# Patient Record
Sex: Male | Born: 1970 | Race: White | Hispanic: No | Marital: Single | Smoking: Never smoker
Health system: Southern US, Community
[De-identification: ages and names within clinical notes are randomized; demographics above are authoritative.]

## PROBLEM LIST (undated history)

## (undated) DIAGNOSIS — E079 Disorder of thyroid, unspecified: Secondary | ICD-10-CM

## (undated) HISTORY — PX: NASAL SEPTUM SURGERY: SHX37

## (undated) HISTORY — PX: HEEL SPUR SURGERY: SHX665

---

## 2018-05-20 ENCOUNTER — Other Ambulatory Visit: Payer: Self-pay

## 2018-05-20 ENCOUNTER — Ambulatory Visit
Admission: EM | Admit: 2018-05-20 | Discharge: 2018-05-20 | Disposition: A | Discharge status: Home / Self Care | Payer: 59 | Attending: Emergency Medicine | Admitting: Emergency Medicine

## 2018-05-20 ENCOUNTER — Ambulatory Visit (INDEPENDENT_AMBULATORY_CARE_PROVIDER_SITE_OTHER): Payer: 59

## 2018-05-20 DIAGNOSIS — R079 Chest pain, unspecified: Secondary | ICD-10-CM

## 2018-05-20 DIAGNOSIS — M940 Chondrocostal junction syndrome [Tietze]: Secondary | ICD-10-CM

## 2018-05-20 HISTORY — DX: Disorder of thyroid, unspecified: E07.9

## 2018-05-20 MED ORDER — NAPROXEN 500 MG PO TABS: 500.0000 mg | ORAL_TABLET | Freq: Two times a day (BID) | ORAL | 0 refills | Status: DC

## 2018-05-20 NOTE — Discharge Instructions (Addendum)
Your EKG and chest x-ray were both normal.  Suspect that this is musculoskeletal.  Try Naprosyn 500 mg twice a day for the next 5 to 7 days.  You may take 1 g of Tylenol with this.  Follow-up with your doctor if you are not getting better in a week, and go immediately to the ER for chest pressure, chest heaviness, if the pain goes up your neck, down your arm, through to your back, if he gets worse with walking around, if you pass out, if you have trouble breathing, or for any other concerns.

## 2018-05-20 NOTE — ED Triage Notes (Signed)
Pt with tenderness to right chest. Radiates with movement. Pain 3/10. Sx x past 3-4 days. Tender to touch

## 2018-05-20 NOTE — ED Provider Notes (Signed)
HPI  SUBJECTIVE:  Anthony Duncan is a 48 y.o. male who presents with 3 days of right-sided chest pain at the sternal/costochondral junction.  He describes the pain as being sore, like a bruise, and radiates to both sides of his chest. It Is intermittent, depends on movement.  He points to his right sternum/costochondral junction with 2 fingers as the area of pain and states that this area is tender.  He is able to exercise without any problem.  He denies nausea, vomiting, diaphoresis, coughing, wheezing, shortness of breath, dyspnea on exertion, hemoptysis, calf pain, swelling, surgery in the past 4 weeks, prolonged immobilization, exogenous estrogen.  No change in his physical activity, trauma to his chest.  No fall.  No rash, erythema, bruising.  He had symptoms like this 3 years ago had a normal EKG and stress test, no cause found.  He tried ibuprofen 600 mg without improvement of symptoms.  Symptoms are better with sitting up straight and holding his shoulders back, worse with palpation, having his arms extended forward, torso rotation and moving his right arm in a rowing motion.  Past medical history negative for DVT, PE, cancer, diabetes, hypertension, coronary disease, hypercholesterolemia, smoking, pneumothorax, renal insufficiency, GI bleed, gastric ulcers.  Family history significant for maternal grandfather with MI maybe in his 39s, patient is not entirely sure.  PMD: [facility].    Past Medical History:  Diagnosis Date  . Thyroid disease     Past Surgical History:  Procedure Laterality Date  . NASAL SEPTUM SURGERY      History reviewed. No pertinent family history.  Social History   Tobacco Use  . Smoking status: Never Smoker  . Smokeless tobacco: Never Used  Substance Use Topics  . Alcohol use: Never    Frequency: Never  . Drug use: Never    No current facility-administered medications for this encounter.   Current Outpatient Medications:  .  levothyroxine  (SYNTHROID, LEVOTHROID) 88 MCG tablet, Take by mouth., Disp: , Rfl:  .  Multiple Vitamin (MULTIVITAMIN) tablet, Take 1 tablet by mouth daily., Disp: , Rfl:  .  loratadine (CLARITIN) 10 MG tablet, Take by mouth., Disp: , Rfl:  .  naproxen (NAPROSYN) 500 MG tablet, Take 1 tablet (500 mg total) by mouth 2 (two) times daily. X 7 days, Disp: 14 tablet, Rfl: 0  Allergies  Allergen Reactions  . Sumatriptan Succinate Other (See Comments)    Headache     ROS  As noted in HPI.   Physical Exam  BP 131/71 (BP Location: Right Arm)   Pulse 70   Temp 97.7 F (36.5 C) (Oral)   Resp 16   Ht 6' (1.829 m)   Wt 117.9 kg   SpO2 100%   BMI 35.26 kg/m   Constitutional: Well developed, well nourished, no acute distress Eyes:  EOMI, conjunctiva normal bilaterally HENT: Normocephalic, atraumatic,mucus membranes moist Respiratory: Normal inspiratory effort lungs clear bilaterally, good air movement. Cardiovascular: Normal rate regular rhythm, no murmurs, rubs, gallops.  Positive tenderness at the costochondral junctions at the sternum bilaterally.  Pain aggravated with torso rotation, movement.  RP 2+ and equal bilaterally. GI: nondistended skin: No rash, bruising over the chest, skin intact Musculoskeletal: Calves symmetric, nontender, no edema.  No palpable cord. Neurologic: Alert & oriented x 3, no focal neuro deficits Psychiatric: Speech and behavior appropriate  ED Course   Medications - No data to display  Orders Placed This Encounter  Procedures  . DG Chest 2 View  Standing Status:   Standing    Number of Occurrences:   1    Order Specific Question:   Reason for Exam (SYMPTOM  OR DIAGNOSIS REQUIRED)    Answer:   sternal CP  . ED EKG    Standing Status:   Standing    Number of Occurrences:   1    Order Specific Question:   Reason for Exam    Answer:   Chest Pain    No results found for this or any previous visit (from the past 24 hour(s)). Dg Chest 2 View  Result Date:  05/20/2018 CLINICAL DATA:  Substernal chest pain x 3 days with no breathing difficulties. EXAM: CHEST - 2 VIEW COMPARISON:  None. FINDINGS: Midline trachea. Normal heart size and mediastinal contours. No pleural effusion or pneumothorax. Clear lungs. IMPRESSION: Normal chest. Electronically Signed   By: Jeronimo Greaves M.D.   On: 05/20/2018 11:57    ED Clinical Impression  Costochondritis   ED Assessment/Plan  Patient PERC negative.  Appears to be very musculoskeletal as he does have reproducible chest wall tenderness in the left and right.  However will check an EKG and a chest x-ray  EKG: Normal sinus rhythm, rate 65.  Normal axis, normal intervals.  No hypertrophy.  No previous EKG for comparison.  Reviewed imaging independently.  Normal chest x-ray see radiology report for full details.  EKG reassuring, chest x-ray normal.  Will treat as costochondritis with 500 mg of Naprosyn twice daily for the next 5 to 7 days.  Follow-up with PMD as needed, to the ER if he gets worse.  Discussed imaging, MDM, treatment plan, and plan for follow-up with patient. Discussed sn/sx that should prompt return to the ED. patient agrees with plan.   Meds ordered this encounter  Medications  . naproxen (NAPROSYN) 500 MG tablet    Sig: Take 1 tablet (500 mg total) by mouth 2 (two) times daily. X 7 days    Dispense:  14 tablet    Refill:  0    *This clinic note was created using Scientist, clinical (histocompatibility and immunogenetics). Therefore, there may be occasional mistakes despite careful proofreading.   ?   Domenick Gong, MD 05/20/18 1810

## 2019-11-27 ENCOUNTER — Encounter: Payer: Self-pay | Admitting: Emergency Medicine

## 2019-11-27 ENCOUNTER — Ambulatory Visit
Admission: EM | Admit: 2019-11-27 | Discharge: 2019-11-27 | Disposition: A | Discharge status: Home / Self Care | Payer: BC Managed Care – PPO | Attending: Emergency Medicine | Admitting: Emergency Medicine

## 2019-11-27 ENCOUNTER — Other Ambulatory Visit: Payer: Self-pay

## 2019-11-27 ENCOUNTER — Ambulatory Visit (INDEPENDENT_AMBULATORY_CARE_PROVIDER_SITE_OTHER): Payer: BC Managed Care – PPO

## 2019-11-27 DIAGNOSIS — R0602 Shortness of breath: Secondary | ICD-10-CM | POA: Diagnosis present

## 2019-11-27 DIAGNOSIS — Z79899 Other long term (current) drug therapy: Secondary | ICD-10-CM | POA: Insufficient documentation

## 2019-11-27 DIAGNOSIS — Z8349 Family history of other endocrine, nutritional and metabolic diseases: Secondary | ICD-10-CM | POA: Diagnosis not present

## 2019-11-27 DIAGNOSIS — Z791 Long term (current) use of non-steroidal anti-inflammatories (NSAID): Secondary | ICD-10-CM | POA: Diagnosis not present

## 2019-11-27 DIAGNOSIS — R05 Cough: Secondary | ICD-10-CM | POA: Diagnosis not present

## 2019-11-27 DIAGNOSIS — Z79890 Hormone replacement therapy (postmenopausal): Secondary | ICD-10-CM | POA: Diagnosis not present

## 2019-11-27 DIAGNOSIS — R059 Cough, unspecified: Secondary | ICD-10-CM

## 2019-11-27 DIAGNOSIS — E079 Disorder of thyroid, unspecified: Secondary | ICD-10-CM | POA: Diagnosis not present

## 2019-11-27 DIAGNOSIS — Z888 Allergy status to other drugs, medicaments and biological substances status: Secondary | ICD-10-CM | POA: Diagnosis not present

## 2019-11-27 DIAGNOSIS — R0789 Other chest pain: Secondary | ICD-10-CM | POA: Diagnosis not present

## 2019-11-27 DIAGNOSIS — Z20822 Contact with and (suspected) exposure to covid-19: Secondary | ICD-10-CM | POA: Diagnosis not present

## 2019-11-27 DIAGNOSIS — T7840XA Allergy, unspecified, initial encounter: Secondary | ICD-10-CM

## 2019-11-27 LAB — SARS CORONAVIRUS 2 (TAT 6-24 HRS): SARS Coronavirus 2: NEGATIVE

## 2019-11-27 MED ORDER — DICLOFENAC SODIUM 75 MG PO TBEC: 75.0000 mg | DELAYED_RELEASE_TABLET | Freq: Two times a day (BID) | ORAL | 0 refills | Status: AC | PRN

## 2019-11-27 MED ORDER — ALBUTEROL SULFATE HFA 108 (90 BASE) MCG/ACT IN AERS: 2.0000 | INHALATION_SPRAY | Freq: Four times a day (QID) | RESPIRATORY_TRACT | 0 refills | Status: AC | PRN

## 2019-11-27 NOTE — ED Triage Notes (Signed)
Patient in today c/o sob this morning ~1am. Patient did call EMS and his bp was elevated, but O2Sat was 100%. Patient states his PCP started him on Amitriptyline on 11/16/19. Patient states on 11/23/19 he started with itching, rash, swelling in hands and stiffness in fingers. Patient states his fingers were red. Patient's last dose of on 11/22/19.

## 2019-11-27 NOTE — Discharge Instructions (Addendum)
Recommend continue Prednisone as prescribed. May continue Benadryl every 6 hours as needed for itching. Recommend use Albuterol inhaler 2 puffs every 6 hours as needed for shortness of breath or cough. May start Voltaren 75mg  twice a day as needed for pain- take with food. Continue to monitor symptoms. Follow-up pending COVID 19 test results and in 3 to 4 days with your PCP if symptoms are not improving. If any increase in difficulty breathing, chest pain, blurred vision or nausea occurs, go to the ER ASAP. Otherwise, follow-up with your PCP as planned.

## 2019-11-27 NOTE — ED Provider Notes (Signed)
[facility]    CSN: [id number] Arrival date & time: 11/27/19  [id number]      History   Chief Complaint Chief Complaint  Patient presents with  . Shortness of Breath    HPI Anthony Duncan is a 49 y.o. male.   49 year old male presents with shortness of breath that has occurred intermittently over the past 4 days. He was first started on a new medication, Amitriptyline, to try to help with sleep, on 11/16/19. About 1 week after taking the medication (11/23/19), he started itching and breaking out in hives. He also noticed that his fingers were swollen and red. He stopped the medication and called [facility] on 8/28 in which he was prescribed a 6 day steroid pack and started taking Benadryl every 6 hours. The hives and itching have improved but have not completely resolved and 3 days ago he started having some shortness of breath, slight cough and central back pain- especially when breathing. He also has had some nausea but no vomiting or fever. He has noticed more stomach upset after starting Prednisone. He denies any vision changes, anterior chest pain, or numbness. This morning at 1am he was struggling to breath and called EMS. They came and evaluated him- his O2Sat was 100% but he indicated his blood pressure was elevated. No other abnormalities detected. They offered to take him to the ER but warned him that in the current situation of COVID and ER capacity, that he would probably wait for over 20 hours before being seen. He called his PCP around 8am and they told him to be evaluated here. He has been vaccinated against COVID 19 and no known exposure. He does not smoke. No history of asthma or lung disease. No history of HTN or Cardiac disease. Other chronic health issues include thyroid disease and environmental allergies/drug allergies and currently on Synthroid and Claritin daily.   The history is provided by the patient.    Past Medical History:  Diagnosis Date  . Thyroid disease       There are no problems to display for this patient.   Past Surgical History:  Procedure Laterality Date  . HEEL SPUR SURGERY    . NASAL SEPTUM SURGERY         Home Medications    Prior to Admission medications   Medication Sig Start Date End Date Taking? Authorizing Provider  levothyroxine (SYNTHROID, LEVOTHROID) 88 MCG tablet Take by mouth. 01/24/17  Yes [provider]  loratadine (CLARITIN) 10 MG tablet Take by mouth.   Yes [provider]  Multiple Vitamin (MULTIVITAMIN) tablet Take 1 tablet by mouth daily.   Yes [provider]  albuterol (VENTOLIN HFA) 108 (90 Base) MCG/ACT inhaler Inhale 2 puffs into the lungs every 6 (six) hours as needed for shortness of breath (or cough). 11/27/19   Sudie GrumblingAmyot, Wilfredo Canterbury Berry, NP  diclofenac (VOLTAREN) 75 MG EC tablet Take 1 tablet (75 mg total) by mouth 2 (two) times daily as needed for moderate pain. 11/27/19   Sudie GrumblingAmyot, Khayri Kargbo Berry, NP    Family History Family History  Problem Relation Age of Onset  . Thyroid disease Mother   . Kidney disease Mother   . Healthy Father     Social History Social History   Tobacco Use  . Smoking status: Never Smoker  . Smokeless tobacco: Never Used  Vaping Use  . Vaping Use: Never used  Substance Use Topics  . Alcohol use: Never  . Drug use: Never  Allergies   Amitriptyline, Other, and Sumatriptan succinate   Review of Systems Review of Systems  Constitutional: Positive for fatigue. Negative for activity change, appetite change, chills, diaphoresis and fever.  HENT: Negative for congestion, facial swelling, sore throat and trouble swallowing.   Eyes: Negative for photophobia and visual disturbance.  Respiratory: Positive for cough and shortness of breath. Negative for chest tightness and wheezing.   Cardiovascular: Positive for chest pain (posterior). Negative for palpitations.  Gastrointestinal: Positive for abdominal pain (mainly after taking steroids) and nausea.  Negative for vomiting.  Genitourinary: Negative for decreased urine volume, difficulty urinating, flank pain and hematuria.  Musculoskeletal: Positive for back pain. Negative for neck pain and neck stiffness.  Skin: Positive for color change and rash. Negative for wound.  Allergic/Immunologic: Positive for environmental allergies. Negative for immunocompromised state.  Neurological: Negative for dizziness, tremors, seizures, syncope, facial asymmetry, speech difficulty, weakness, light-headedness, numbness and headaches.  Hematological: Negative for adenopathy. Does not bruise/bleed easily.  Psychiatric/Behavioral: Positive for sleep disturbance.     Physical Exam Triage Vital Signs ED Triage Vitals  Enc Vitals Group     BP 11/27/19 0915 129/81     Pulse Rate 11/27/19 0915 77     Resp 11/27/19 0915 18     Temp 11/27/19 0915 98.3 F (36.8 C)     Temp Source 11/27/19 0915 Oral     SpO2 11/27/19 0915 98 %     Weight 11/27/19 0914 289 lb (131.1 kg)     Height 11/27/19 0914 6' (1.829 m)     Head Circumference --      Peak Flow --      Pain Score 11/27/19 0914 0     Pain Loc --      Pain Edu? --      Excl. in GC? --    No data found.  Updated Vital Signs BP 129/81 (BP Location: Left Arm)   Pulse 77   Temp 98.3 F (36.8 C) (Oral)   Resp 18   Ht 6' (1.829 m)   Wt 289 lb (131.1 kg)   SpO2 98%   BMI 39.20 kg/m   Visual Acuity Right Eye Distance:   Left Eye Distance:   Bilateral Distance:    Right Eye Near:   Left Eye Near:    Bilateral Near:     Physical Exam Vitals and nursing note reviewed.  Constitutional:      General: He is awake. He is not in acute distress.    Appearance: He is well-developed, well-groomed and overweight. He is not ill-appearing.     Comments: He is sitting comfortably in the exam chair in no acute distress.   HENT:     Head: Normocephalic and atraumatic.     Right Ear: Hearing, tympanic membrane, ear canal and external ear normal.     Left  Ear: Hearing, tympanic membrane, ear canal and external ear normal.     Nose: Nose normal.     Mouth/Throat:     Lips: Pink.     Mouth: Mucous membranes are moist.     Pharynx: Oropharynx is clear. Uvula midline. No pharyngeal swelling, oropharyngeal exudate, posterior oropharyngeal erythema or uvula swelling.  Eyes:     Extraocular Movements: Extraocular movements intact.     Conjunctiva/sclera: Conjunctivae normal.     Pupils: Pupils are equal, round, and reactive to light.  Neck:     Vascular: Normal carotid pulses. No carotid bruit or JVD.     Trachea: Trachea  normal.  Cardiovascular:     Rate and Rhythm: Normal rate and regular rhythm.     Pulses: Normal pulses.     Heart sounds: Normal heart sounds. No murmur heard.   Pulmonary:     Effort: Pulmonary effort is normal. No respiratory distress.     Breath sounds: Normal breath sounds and air entry. No decreased air movement. No decreased breath sounds, wheezing, rhonchi or rales.  Musculoskeletal:        General: Normal range of motion.     Cervical back: Normal range of motion and neck supple. No rigidity or tenderness. Normal range of motion.     Right lower leg: No edema.     Left lower leg: No edema.  Lymphadenopathy:     Cervical: No cervical adenopathy.  Skin:    General: Skin is warm and dry.     Capillary Refill: Capillary refill takes less than 2 seconds.     Findings: Rash present. No bruising, ecchymosis, petechiae or wound. Rash is urticarial.     Comments: Occasional hives/urticarial rash present around wrists and lower arms during exam but mostly resolved. No other rash/lesions seen.   Neurological:     General: No focal deficit present.     Mental Status: He is alert and oriented to person, place, and time.     Cranial Nerves: Cranial nerves are intact.     Sensory: Sensation is intact. No sensory deficit.     Motor: Motor function is intact.     Gait: Gait is intact.  Psychiatric:        Attention and  Perception: Attention normal.        Mood and Affect: Mood is anxious.        Speech: Speech normal.        Behavior: Behavior is cooperative.        Thought Content: Thought content normal.        Cognition and Memory: Cognition and memory normal.        Judgment: Judgment normal.     Comments: Appears a little anxious      UC Treatments / Results  Labs (all labs ordered are listed, but only abnormal results are displayed) Labs Reviewed  SARS CORONAVIRUS 2 (TAT 6-24 HRS)    EKG   Radiology DG Chest 2 View  Result Date: 11/27/2019 CLINICAL DATA:  Shortness of breath and cough EXAM: CHEST - 2 VIEW COMPARISON:  05/20/2018 FINDINGS: The heart size and mediastinal contours are within normal limits. Both lungs are clear. Disc degenerative disease of the thoracic spine. IMPRESSION: No acute abnormality of the lungs. Electronically Signed   By: Lauralyn Primes M.D.   On: 11/27/2019 11:03    Procedures ED EKG  Date/Time: 11/27/2019 10:33 AM Performed by: Sudie Grumbling, NP Authorized by: Sudie Grumbling, NP   ECG reviewed by ED Physician in the absence of a cardiologist: yes   Previous ECG:    Previous ECG:  Unavailable Interpretation:    Interpretation: normal   Rate:    ECG rate:  62   ECG rate assessment: normal   Rhythm:    Rhythm: sinus rhythm   Ectopy:    Ectopy: none   Conduction:    Conduction: normal   ST segments:    ST segments:  Normal Comments:     Reviewed by Dr. Chaney Malling- no acute changes- no distinct abnormalities detected.    (including critical care time)  Medications Ordered in UC Medications -  No data to display  Initial Impression / Assessment and Plan / UC Course  I have reviewed the triage vital signs and the nursing notes.  Pertinent labs & imaging results that were available during my care of the patient were reviewed by me and considered in my medical decision making (see chart for details).    Reviewed ECG results with patient- no  abnormalities detected. Discussed chest x-ray results with patient- no acute lung disease/disorder- did note degenerative disc disease in thoracic spine and discussed that this may be causing some posterior central back/chest pain. Reviewed PERC Rule for probability of pulmonary emboli with Dr. Chaney Malling- result is zero. Doubt PE or Cardiac origin for symptoms. Discussed that shortness of breath may still be part of his allergic reaction to medication and he may have some anxiety due to the reaction. Recommend finish Prednisone as prescribed. May trial Albuterol inhaler 2 puffs every 6 hours as needed for cough or shortness of breath. May trial Voltaren 75mg  twice a day as needed for back pain. Continue to monitor symptoms. May continue Benadryl every 6 hours as needed for itching. Follow-up pending COVID 19 test results and in 3 to 4 days with his PCP if symptoms remain. If any increase in difficulty breathing, chest pain, increase in nausea, blurred vision or fever occurs, go to the ER ASAP. Otherwise, follow-up with his PCP as planned.  Final Clinical Impressions(s) / UC Diagnoses   Final diagnoses:  Shortness of breath  Posterior chest pain  Cough  Allergic reaction, initial encounter     Discharge Instructions     Recommend continue Prednisone as prescribed. May continue Benadryl every 6 hours as needed for itching. Recommend use Albuterol inhaler 2 puffs every 6 hours as needed for shortness of breath or cough. May start Voltaren 75mg  twice a day as needed for pain- take with food. Continue to monitor symptoms. Follow-up pending COVID 19 test results and in 3 to 4 days with your PCP if symptoms are not improving. If any increase in difficulty breathing, chest pain, blurred vision or nausea occurs, go to the ER ASAP. Otherwise, follow-up with your PCP as planned.     ED Prescriptions    Medication Sig Dispense Auth. Provider   albuterol (VENTOLIN HFA) 108 (90 Base) MCG/ACT inhaler Inhale 2  puffs into the lungs every 6 (six) hours as needed for shortness of breath (or cough). 18 g , NP   diclofenac (VOLTAREN) 75 MG EC tablet Take 1 tablet (75 mg total) by mouth 2 (two) times daily as needed for moderate pain. 30 tablet Tammy Ericsson, , NP     PDMP not reviewed this encounter.   Sudie Grumbling, NP 11/28/19 [phone]

## 2020-11-21 IMAGING — CR DG CHEST 2V
3 series · 3 of 3 positions shown · non-contrast
Comparison: 05/20/2018

CLINICAL DATA: Shortness of breath and cough

EXAM:
CHEST - 2 VIEW

[chest pa]
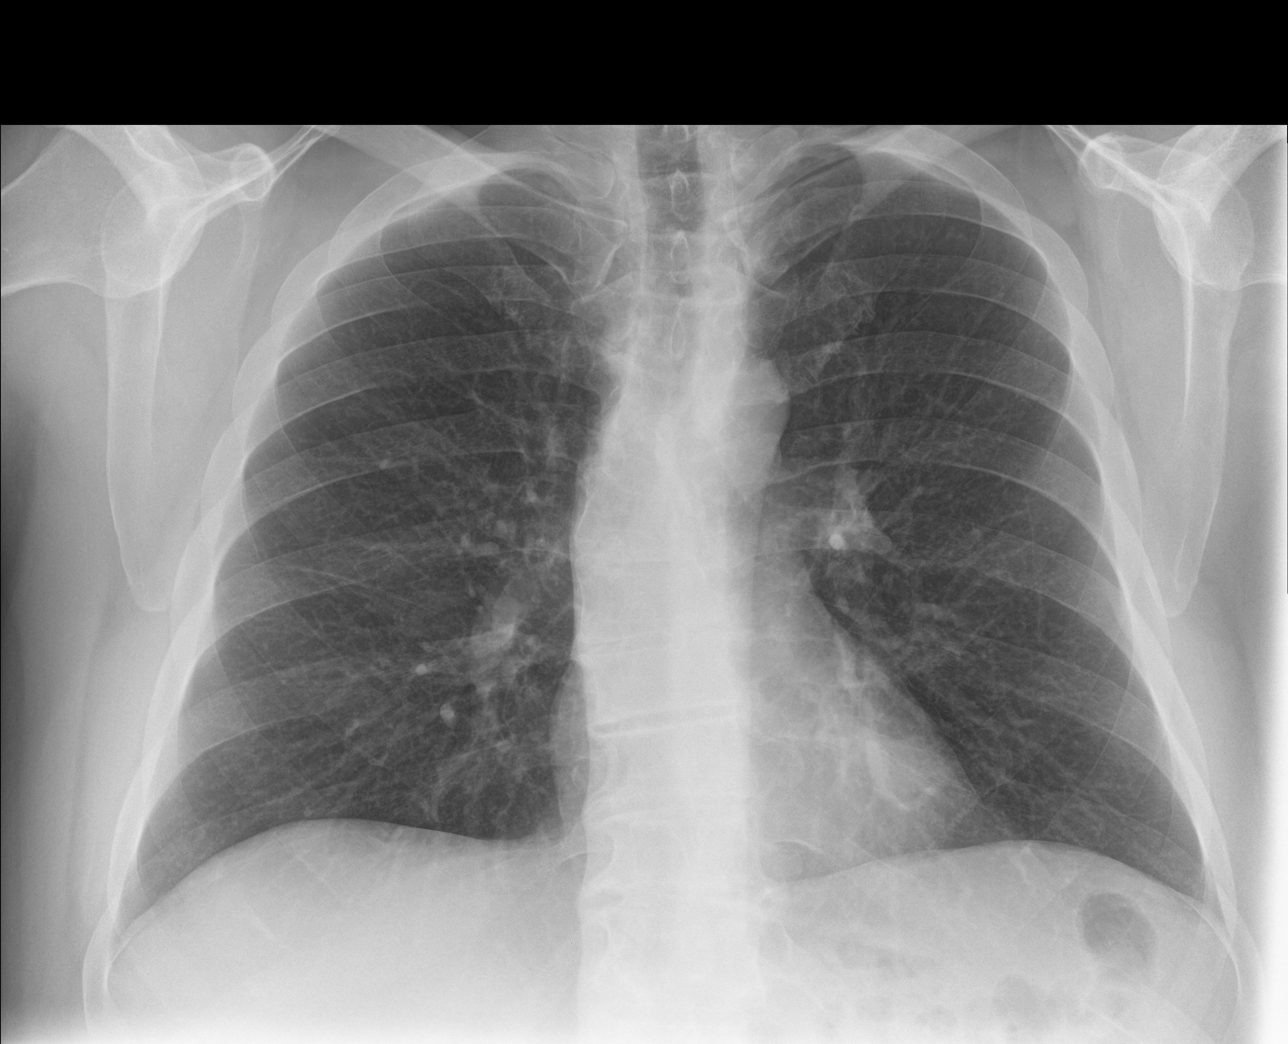

[chest lat]
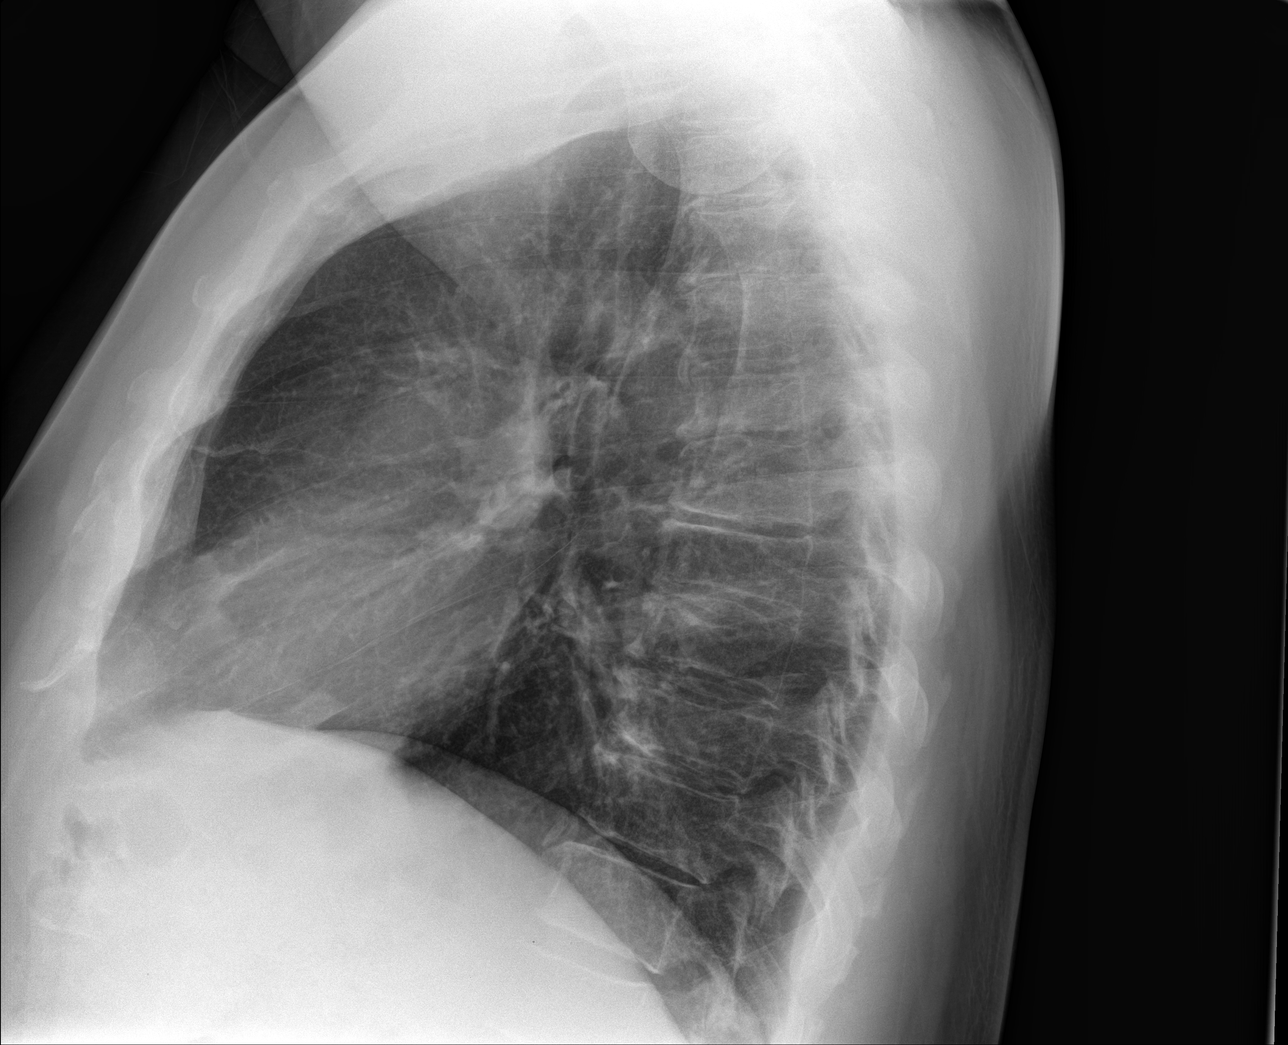

[chest ap]
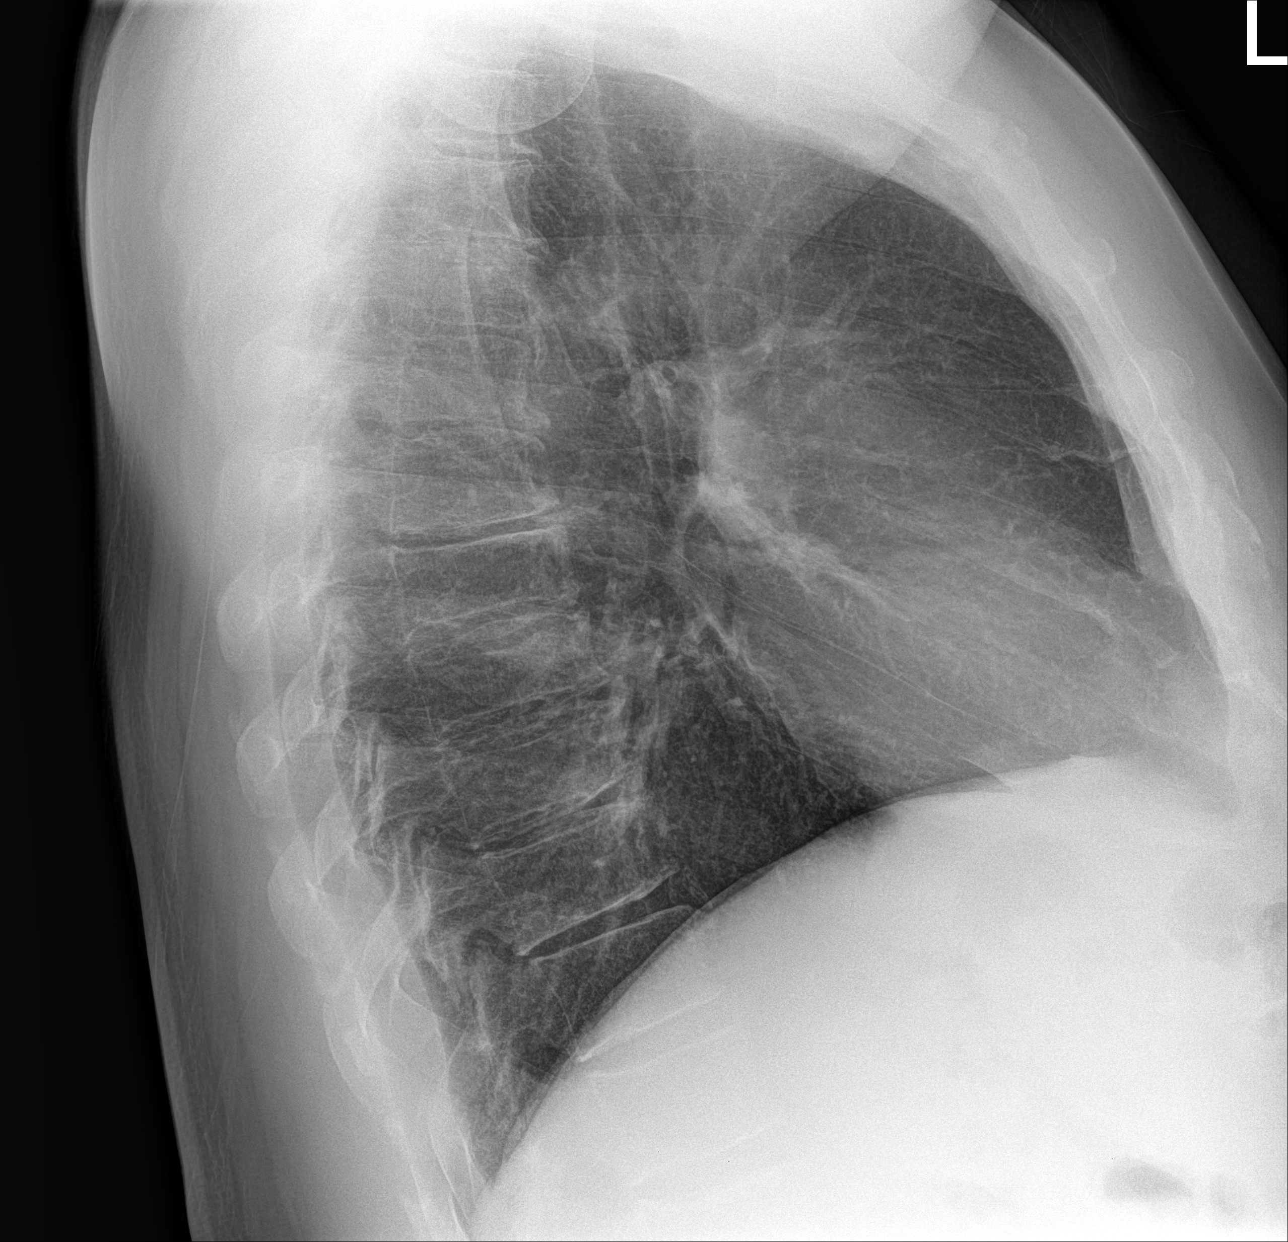

[3 of 3 positions shown; findings below may reference images not displayed]

FINDINGS: The heart size and mediastinal contours are within normal limits.
Both lungs are clear. Disc degenerative disease of the thoracic
spine.
IMPRESSION: No acute abnormality of the lungs.

## 2022-03-29 DIAGNOSIS — H66001 Acute suppurative otitis media without spontaneous rupture of ear drum, right ear: Secondary | ICD-10-CM | POA: Diagnosis not present

## 2022-05-13 DIAGNOSIS — Z0000 Encounter for general adult medical examination without abnormal findings: Secondary | ICD-10-CM | POA: Diagnosis not present

## 2022-05-13 DIAGNOSIS — M79605 Pain in left leg: Secondary | ICD-10-CM | POA: Diagnosis not present

## 2022-05-13 DIAGNOSIS — E039 Hypothyroidism, unspecified: Secondary | ICD-10-CM | POA: Diagnosis not present

## 2022-05-14 DIAGNOSIS — Z0000 Encounter for general adult medical examination without abnormal findings: Secondary | ICD-10-CM | POA: Diagnosis not present

## 2022-06-29 DIAGNOSIS — Z1211 Encounter for screening for malignant neoplasm of colon: Secondary | ICD-10-CM | POA: Diagnosis not present

## 2022-06-29 DIAGNOSIS — K648 Other hemorrhoids: Secondary | ICD-10-CM | POA: Diagnosis not present

## 2022-09-04 DIAGNOSIS — H9201 Otalgia, right ear: Secondary | ICD-10-CM | POA: Diagnosis not present

## 2022-11-10 DIAGNOSIS — M5459 Other low back pain: Secondary | ICD-10-CM | POA: Diagnosis not present

## 2022-11-13 DIAGNOSIS — M5459 Other low back pain: Secondary | ICD-10-CM | POA: Diagnosis not present

## 2022-11-16 DIAGNOSIS — M5459 Other low back pain: Secondary | ICD-10-CM | POA: Diagnosis not present
